# Patient Record
Sex: Female | Born: 2000 | Race: Black or African American | Hispanic: No | Marital: Single | Smoking: Never smoker
Health system: Southern US, Community
[De-identification: ages and names within clinical notes are randomized; demographics above are authoritative.]

## PROBLEM LIST (undated history)

## (undated) DIAGNOSIS — F329 Major depressive disorder, single episode, unspecified: Secondary | ICD-10-CM

## (undated) DIAGNOSIS — F32A Depression, unspecified: Secondary | ICD-10-CM

## (undated) DIAGNOSIS — F909 Attention-deficit hyperactivity disorder, unspecified type: Secondary | ICD-10-CM

## (undated) DIAGNOSIS — J45909 Unspecified asthma, uncomplicated: Secondary | ICD-10-CM

---
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
## 2021-04-02 ENCOUNTER — Other Ambulatory Visit: Payer: Self-pay

## 2021-04-03 ENCOUNTER — Other Ambulatory Visit: Payer: Self-pay

## 2021-04-03 ENCOUNTER — Encounter (HOSPITAL_COMMUNITY): Payer: Self-pay

## 2021-04-03 ENCOUNTER — Emergency Department (HOSPITAL_COMMUNITY): Admission: EM | Admit: 2021-04-03 | Payer: Medicaid Other | Source: Home / Self Care

## 2021-04-03 ENCOUNTER — Emergency Department (HOSPITAL_COMMUNITY)
Admission: EM | Admit: 2021-04-03 | Discharge: 2021-04-03 | Disposition: A | Discharge status: Home / Self Care | Payer: Medicaid Other | Attending: Emergency Medicine | Admitting: Emergency Medicine

## 2021-04-03 DIAGNOSIS — Z79899 Other long term (current) drug therapy: Secondary | ICD-10-CM | POA: Diagnosis not present

## 2021-04-03 DIAGNOSIS — J069 Acute upper respiratory infection, unspecified: Secondary | ICD-10-CM | POA: Diagnosis not present

## 2021-04-03 DIAGNOSIS — J45909 Unspecified asthma, uncomplicated: Secondary | ICD-10-CM | POA: Diagnosis not present

## 2021-04-03 DIAGNOSIS — Z20822 Contact with and (suspected) exposure to covid-19: Secondary | ICD-10-CM | POA: Diagnosis not present

## 2021-04-03 DIAGNOSIS — Z7722 Contact with and (suspected) exposure to environmental tobacco smoke (acute) (chronic): Secondary | ICD-10-CM | POA: Diagnosis not present

## 2021-04-03 DIAGNOSIS — J029 Acute pharyngitis, unspecified: Secondary | ICD-10-CM | POA: Diagnosis present

## 2021-04-03 LAB — CBC WITH DIFFERENTIAL/PLATELET
Abs Immature Granulocytes: 0.03 10*3/uL (ref 0.00–0.07)
Basophils Absolute: 0 10*3/uL (ref 0.0–0.1)
Basophils Relative: 0 %
Eosinophils Absolute: 0.2 10*3/uL (ref 0.0–0.5)
Eosinophils Relative: 2 %
HCT: 34.7 % — ABNORMAL LOW (ref 36.0–46.0)
Hemoglobin: 11.9 g/dL — ABNORMAL LOW (ref 12.0–15.0)
Immature Granulocytes: 0 %
Lymphocytes Relative: 25 %
Lymphs Abs: 2.7 10*3/uL (ref 0.7–4.0)
MCH: 24.5 pg — ABNORMAL LOW (ref 26.0–34.0)
MCHC: 34.3 g/dL (ref 30.0–36.0)
MCV: 71.5 fL — ABNORMAL LOW (ref 80.0–100.0)
Monocytes Absolute: 1 10*3/uL (ref 0.1–1.0)
Monocytes Relative: 9 %
Neutro Abs: 6.8 10*3/uL (ref 1.7–7.7)
Neutrophils Relative %: 64 %
Platelets: 325 10*3/uL (ref 150–400)
RBC: 4.85 MIL/uL (ref 3.87–5.11)
RDW: 13.4 % (ref 11.5–15.5)
WBC: 10.7 10*3/uL — ABNORMAL HIGH (ref 4.0–10.5)
nRBC: 0 % (ref 0.0–0.2)

## 2021-04-03 LAB — URINALYSIS, ROUTINE W REFLEX MICROSCOPIC
Bilirubin Urine: NEGATIVE
Glucose, UA: NEGATIVE mg/dL
Ketones, ur: NEGATIVE mg/dL
Nitrite: POSITIVE — AB
Protein, ur: NEGATIVE mg/dL
Specific Gravity, Urine: 1.029 (ref 1.005–1.030)
pH: 5 (ref 5.0–8.0)

## 2021-04-03 LAB — COMPREHENSIVE METABOLIC PANEL
ALT: 9 U/L (ref 0–44)
AST: 16 U/L (ref 15–41)
Albumin: 4 g/dL (ref 3.5–5.0)
Alkaline Phosphatase: 57 U/L (ref 38–126)
Anion gap: 6 (ref 5–15)
BUN: 12 mg/dL (ref 6–20)
CO2: 28 mmol/L (ref 22–32)
Calcium: 9.2 mg/dL (ref 8.9–10.3)
Chloride: 103 mmol/L (ref 98–111)
Creatinine, Ser: 0.74 mg/dL (ref 0.44–1.00)
GFR, Estimated: >60 mL/min (ref >60)
Glucose, Bld: 99 mg/dL (ref 70–99)
Potassium: 4.1 mmol/L (ref 3.5–5.1)
Sodium: 137 mmol/L (ref 135–145)
Total Bilirubin: 0.3 mg/dL (ref 0.3–1.2)
Total Protein: 7.7 g/dL (ref 6.5–8.1)

## 2021-04-03 LAB — GROUP A STREP BY PCR: Group A Strep by PCR: NOT DETECTED

## 2021-04-03 LAB — RESP PANEL BY RT-PCR (FLU A&B, COVID) ARPGX2
Influenza A by PCR: NEGATIVE
Influenza B by PCR: NEGATIVE
SARS Coronavirus 2 by RT PCR: NEGATIVE

## 2021-04-03 LAB — I-STAT BETA HCG BLOOD, ED (MC, WL, AP ONLY): I-stat hCG, quantitative: <5 m[IU]/mL (ref <5)

## 2021-04-03 MED ORDER — FOSFOMYCIN TROMETHAMINE 3 G PO PACK
3.0000 g | PACK | Freq: Once | ORAL | Status: AC
  Administered 2021-04-03: 3 g via ORAL
  Filled 2021-04-03: qty 3

## 2021-04-03 MED ORDER — ALBUTEROL SULFATE HFA 108 (90 BASE) MCG/ACT IN AERS
2.0000 | INHALATION_SPRAY | Freq: Once | RESPIRATORY_TRACT | Status: AC
  Administered 2021-04-03: 2 via RESPIRATORY_TRACT
  Filled 2021-04-03: qty 6.7

## 2021-04-03 NOTE — ED Notes (Signed)
Labeled specimen cup provided to pt for urine collection per MD order. ENMiles 

## 2021-04-03 NOTE — ED Triage Notes (Signed)
Pt has sore throat and cough x 2 days.

## 2021-04-03 NOTE — ED Provider Notes (Signed)
[name]-EMERGENCY DEPT Provider Note   CSN: [id number] Arrival date & time: 04/03/21  0043     History Chief Complaint  Patient presents with   Sore Throat   Cough    [name] [name] is a 20 y.o. female.  20 yo F with a chief complaints of sore throat and cough going on for a couple days.  Her daughter has similar symptoms.  She lost her voice.  Is experiencing severe throat pain.  Has pain with swallowing.  Denies urinary symptoms denies abdominal pain denies nausea or vomiting.  The history is provided by the patient.  Sore Throat Pertinent negatives include no chest pain, no headaches and no shortness of breath.  Cough Associated symptoms: sore throat   Associated symptoms: no chest pain, no chills, no fever, no headaches, no myalgias, no rhinorrhea, no shortness of breath and no wheezing   Illness Severity:  Moderate Onset quality:  Gradual Duration:  2 days Timing:  Constant Progression:  Worsening Chronicity:  New Associated symptoms: cough and sore throat   Associated symptoms: no chest pain, no congestion, no fever, no headaches, no myalgias, no nausea, no rhinorrhea, no shortness of breath, no vomiting and no wheezing       Past Medical History:  Diagnosis Date   ADHD (attention deficit hyperactivity disorder)    Asthma    pt. 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 No pertinent surgical history.   OB History   No obstetric history on file.     Family History  Adopted: Yes    Social History   Tobacco Use   Smoking status: Passive Smoke  Exposure - Never Smoker   Smokeless tobacco: Never  Substance Use Topics   Alcohol use: Never   Drug use: Never    Home Medications Prior to Admission medications   Medication Sig Start Date End Date Taking? Authorizing Provider  ARIPiprazole (ABILIFY) 10 MG tablet Take 1 tablet (10 mg total) by mouth daily. 12/28/15   Starkes-Perry, Juel Burrow, FNP  sertraline (ZOLOFT) 100 MG tablet Take 100 mg by mouth daily.    [provider]  traZODone (DESYREL) 150 MG tablet Take 150 mg by mouth at bedtime.    [provider]    Allergies    Patient has no known allergies.  Review of Systems   Review of Systems  Constitutional:  Negative for chills and fever.  HENT:  Positive for sore throat. Negative for congestion and rhinorrhea.   Eyes:  Negative for redness and visual disturbance.  Respiratory:  Positive for cough. Negative for shortness of breath and wheezing.   Cardiovascular:  Negative for chest pain and palpitations.  Gastrointestinal:  Negative for nausea and vomiting.  Genitourinary:  Negative for dysuria and urgency.  Musculoskeletal:  Negative for arthralgias and myalgias.  Skin:  Negative for pallor and wound.  Neurological:  Negative for dizziness and headaches.   Physical Exam Updated Vital Signs BP (!) 141/88 (BP Location: Left Arm)   Pulse 72   Temp 98.8 F (37.1 C) (Axillary)   Resp 16   Ht 5\' 2"  (1.575 m)  Wt 65.8 kg   SpO2 99%   BMI 26.52 kg/m   Physical Exam Vitals and nursing note reviewed.  Constitutional:      General: She is not in acute distress.    Appearance: She is well-developed. She is not diaphoretic.  HENT:     Head: Normocephalic and atraumatic.     Comments: Vesicles to the posterior pharynx.  No tonsillar swellings or exudates.  Tolerating secretions without difficulty. Eyes:     Pupils: Pupils are equal, round, and reactive to light.  Cardiovascular:     Rate and Rhythm: Normal rate and regular rhythm.     Heart sounds:  No murmur heard.   No friction rub. No gallop.  Pulmonary:     Effort: Pulmonary effort is normal.     Breath sounds: No wheezing or rales.  Abdominal:     General: There is no distension.     Palpations: Abdomen is soft.     Tenderness: There is no abdominal tenderness.  Musculoskeletal:        General: No tenderness.     Cervical back: Normal range of motion and neck supple.  Skin:    General: Skin is warm and dry.  Neurological:     Mental Status: She is alert and oriented to person, place, and time.  Psychiatric:        Behavior: Behavior normal.    ED Results / Procedures / Treatments   Labs (all labs ordered are listed, but only abnormal results are displayed) Labs Reviewed  URINALYSIS, ROUTINE W REFLEX MICROSCOPIC - Abnormal; Notable for the following components:      Result Value   APPearance HAZY (*)    Hgb urine dipstick SMALL (*)    Nitrite POSITIVE (*)    Leukocytes,Ua TRACE (*)    Bacteria, UA MANY (*)    All other components within normal limits  CBC WITH DIFFERENTIAL/PLATELET - Abnormal; Notable for the following components:   WBC 10.7 (*)    Hemoglobin 11.9 (*)    HCT 34.7 (*)    MCV 71.5 (*)    MCH 24.5 (*)    All other components within normal limits  RESP PANEL BY RT-PCR (FLU A&B, COVID) ARPGX2  GROUP A STREP BY PCR  COMPREHENSIVE METABOLIC PANEL  I-STAT BETA HCG BLOOD, ED ([facility], [facility], [facility] ONLY)    EKG EKG Interpretation  Date/Time:  Wednesday April 03 2021 02:22:20 EDT Ventricular Rate:  88 PR Interval:  113 QRS Duration: 71 QT Interval:  366 QTC Calculation: 443 R Axis:   67 Text Interpretation: Sinus rhythm Borderline short PR interval No significant change since last tracing Confirmed by Melene Plan [phone]) on 04/03/2021 5:05:05 AM  Radiology No results found.  Procedures Procedures   Medications Ordered in ED Medications - No data to display  ED Course  I have reviewed the triage vital signs and the nursing notes.  Pertinent  labs & imaging results that were available during my care of the patient were reviewed by me and considered in my medical decision making (see chart for details).    MDM Rules/Calculators/A&P                           20 yo F with a chief complaint of a sore throat.  Going on for a couple days.  Well-appearing and nontoxic.  Appears well-hydrated.  Exam most consistent with a viral upper respiratory illness.  Will treat conservatively.  Patient without urinary symptoms but has a UTI on UA.  Will treat with 1 dose of fosfomycin.  PCP follow-up.  5:56 AM:  I have discussed the diagnosis/risks/treatment options with the patient and believe the pt to be eligible for discharge home to follow-up with PCP. We also discussed returning to the ED immediately if new or worsening sx occur. We discussed the sx which are most concerning (e.g., sudden worsening pain, fever, inability to tolerate by mouth) that necessitate immediate return. Medications administered to the patient during their visit and any new prescriptions provided to the patient are listed below.  Medications given during this visit Medications - No data to display   The patient appears reasonably screen and/or stabilized for discharge and I doubt any other medical condition or other [facility] requiring further screening, evaluation, or treatment in the ED at this time prior to discharge.   Final Clinical Impression(s) / ED Diagnoses Final diagnoses:  Upper respiratory tract infection, unspecified type    Rx / DC Orders ED Discharge Orders     None        Melene Plan, DO 04/03/21 [phone]

## 2021-04-03 NOTE — Discharge Instructions (Signed)
Take tylenol 2 pills 4 times a day and motrin 4 pills 3 times a day.  Drink plenty of fluids.  Return for worsening shortness of breath, headache, confusion. Follow up with your family doctor.   

## 2021-04-03 NOTE — ED Notes (Signed)
Pt seen exiting the lobby when asked if she still wanted to be seen patient didn't respond

## 2021-08-03 ENCOUNTER — Emergency Department (HOSPITAL_COMMUNITY)
Admission: EM | Admit: 2021-08-03 | Discharge: 2021-08-03 | Discharge status: Against Medical Advice | Payer: Medicaid Other | Attending: Emergency Medicine | Admitting: Emergency Medicine

## 2021-08-03 ENCOUNTER — Emergency Department (HOSPITAL_COMMUNITY): Payer: Medicaid Other

## 2021-08-03 ENCOUNTER — Other Ambulatory Visit: Payer: Self-pay

## 2021-08-03 DIAGNOSIS — O209 Hemorrhage in early pregnancy, unspecified: Secondary | ICD-10-CM | POA: Insufficient documentation

## 2021-08-03 DIAGNOSIS — N939 Abnormal uterine and vaginal bleeding, unspecified: Secondary | ICD-10-CM

## 2021-08-03 DIAGNOSIS — Z3A08 8 weeks gestation of pregnancy: Secondary | ICD-10-CM | POA: Insufficient documentation

## 2021-08-03 LAB — URINALYSIS, ROUTINE W REFLEX MICROSCOPIC
Bilirubin Urine: NEGATIVE
Glucose, UA: NEGATIVE mg/dL
Hgb urine dipstick: NEGATIVE
Ketones, ur: NEGATIVE mg/dL
Leukocytes,Ua: NEGATIVE
Nitrite: NEGATIVE
Protein, ur: NEGATIVE mg/dL
Specific Gravity, Urine: 1.018 (ref 1.005–1.030)
pH: 8 (ref 5.0–8.0)

## 2021-08-03 LAB — WET PREP, GENITAL
Clue Cells Wet Prep HPF POC: NONE SEEN
Sperm: NONE SEEN
Trich, Wet Prep: NONE SEEN
WBC, Wet Prep HPF POC: <10 (ref <10)
Yeast Wet Prep HPF POC: NONE SEEN

## 2021-08-03 LAB — CBC WITH DIFFERENTIAL/PLATELET
Abs Immature Granulocytes: 0.03 10*3/uL (ref 0.00–0.07)
Basophils Absolute: 0 10*3/uL (ref 0.0–0.1)
Basophils Relative: 1 %
Eosinophils Absolute: 0.4 10*3/uL (ref 0.0–0.5)
Eosinophils Relative: 5 %
HCT: 39.8 % (ref 36.0–46.0)
Hemoglobin: 13.3 g/dL (ref 12.0–15.0)
Immature Granulocytes: 0 %
Lymphocytes Relative: 27 %
Lymphs Abs: 2.4 10*3/uL (ref 0.7–4.0)
MCH: 24.7 pg — ABNORMAL LOW (ref 26.0–34.0)
MCHC: 33.4 g/dL (ref 30.0–36.0)
MCV: 74 fL — ABNORMAL LOW (ref 80.0–100.0)
Monocytes Absolute: 0.6 10*3/uL (ref 0.1–1.0)
Monocytes Relative: 6 %
Neutro Abs: 5.4 10*3/uL (ref 1.7–7.7)
Neutrophils Relative %: 61 %
Platelets: 274 10*3/uL (ref 150–400)
RBC: 5.38 MIL/uL — ABNORMAL HIGH (ref 3.87–5.11)
RDW: 14.3 % (ref 11.5–15.5)
WBC: 8.8 10*3/uL (ref 4.0–10.5)
nRBC: 0 % (ref 0.0–0.2)

## 2021-08-03 LAB — COMPREHENSIVE METABOLIC PANEL
ALT: 10 U/L (ref 0–44)
AST: 15 U/L (ref 15–41)
Albumin: 3.7 g/dL (ref 3.5–5.0)
Alkaline Phosphatase: 45 U/L (ref 38–126)
Anion gap: 7 (ref 5–15)
BUN: 7 mg/dL (ref 6–20)
CO2: 23 mmol/L (ref 22–32)
Calcium: 8.8 mg/dL — ABNORMAL LOW (ref 8.9–10.3)
Chloride: 109 mmol/L (ref 98–111)
Creatinine, Ser: 0.75 mg/dL (ref 0.44–1.00)
GFR, Estimated: >60 mL/min (ref >60)
Glucose, Bld: 81 mg/dL (ref 70–99)
Potassium: 3.8 mmol/L (ref 3.5–5.1)
Sodium: 139 mmol/L (ref 135–145)
Total Bilirubin: 0.3 mg/dL (ref 0.3–1.2)
Total Protein: 6.5 g/dL (ref 6.5–8.1)

## 2021-08-03 LAB — I-STAT BETA HCG BLOOD, ED (MC, WL, AP ONLY): I-stat hCG, quantitative: <5 m[IU]/mL (ref <5)

## 2021-08-03 LAB — HCG, QUANTITATIVE, PREGNANCY: hCG, Beta Chain, Quant, S: <1 m[IU]/mL (ref <5)

## 2021-08-03 LAB — LIPASE, BLOOD: Lipase: 23 U/L (ref 11–51)

## 2021-08-03 NOTE — ED Triage Notes (Addendum)
Pt here for lower abd cramps that started last night w/ vaginal bleeding that started approx 1 hr ago. Pt states she had 4 positive home pregnancy tests last week w/ N/V since. PT states her last menstrual period was "2 months ago"

## 2021-08-03 NOTE — ED Provider Notes (Signed)
[facility] EMERGENCY DEPARTMENT Provider Note   CSN: [id number] Arrival date & time: 08/03/21  1245     History  Chief Complaint  Patient presents with   Abdominal Pain   Vaginal Bleeding    [name] [name] is a 21 y.o. female.  21 y.o female G2P1 currently [redacted] weeks pregnant (per patient) presents to the ED with a chief complaint of vaginal bleeding.  According to patient, her last menstrual period was approximately 2 months ago, she took 4 at home pregnancy test which all showed to be positive.  She has had some nausea along with some vomiting and some vaginal spotting, reports similar symptoms in the past.  On today's that she reports taking a shower prior to arrival and noticing large amounts of blood in the shower, she has had previous spotting with a previous pregnancy however not this much.  Endorsing some lower abdominal cramping.  No vomiting, no diarrhea, no discharge.  The history is provided by the patient and medical records.  Abdominal Pain Pain location:  Suprapubic, LLQ and RLQ Pain quality: cramping   Pain radiates to:  Does not radiate Pain severity:  Moderate Onset quality:  Sudden Duration:  5 hours Timing:  Constant Progression:  Partially resolved Chronicity:  New Associated symptoms: vaginal bleeding   Associated symptoms: no chest pain, no chills, no fever, no nausea, no shortness of breath, no sore throat, no vaginal discharge and no vomiting   Vaginal Bleeding Associated symptoms: abdominal pain   Associated symptoms: no fever, no nausea and no vaginal discharge       Home Medications Prior to Admission medications   Medication Sig Start Date End Date Taking? Authorizing Provider  ARIPiprazole (ABILIFY) 10 MG tablet Take 1 tablet (10 mg total) by mouth daily. 12/28/15   Starkes-Perry, Juel Burrow, FNP  sertraline (ZOLOFT) 100 MG tablet Take 100 mg by mouth daily.    [provider]  traZODone (DESYREL) 150 MG tablet Take 150 mg  by mouth at bedtime.    [provider]      Allergies    Patient has no known allergies.    Review of Systems   Review of Systems  Constitutional:  Negative for chills and fever.  HENT:  Negative for sore throat.   Respiratory:  Negative for shortness of breath.   Cardiovascular:  Negative for chest pain.  Gastrointestinal:  Positive for abdominal pain. Negative for nausea and vomiting.  Genitourinary:  Positive for vaginal bleeding. Negative for flank pain and vaginal discharge.  All other systems reviewed and are negative.  Physical Exam Updated Vital Signs BP (!) 131/93 (BP Location: Right Arm)    Pulse 94    Temp 98.6 F (37 C) (Oral)    Resp 16    SpO2 100%  Physical Exam Vitals and nursing note reviewed. Exam conducted with a chaperone present.  Constitutional:      Appearance: She is well-developed.  HENT:     Head: Normocephalic and atraumatic.  Cardiovascular:     Rate and Rhythm: Normal rate.  Abdominal:     General: Abdomen is flat. Bowel sounds are normal.     Palpations: Abdomen is soft.  Genitourinary:    Exam position: Supine.     Cervix: Cervical bleeding present. No cervical motion tenderness.     Adnexa:        Right: No tenderness.         Left: No tenderness.  Comments: Cervix without any petechia, lesions noted.  Tenderness along the suprapubic area.  No adnexal tenderness or fullness noted on exam. Skin:    General: Skin is warm and dry.  Neurological:     Mental Status: She is alert and oriented to person, place, and time.    ED Results / Procedures / Treatments   Labs (all labs ordered are listed, but only abnormal results are displayed) Labs Reviewed  CBC WITH DIFFERENTIAL/PLATELET - Abnormal; Notable for the following components:      Result Value   RBC 5.38 (*)    MCV 74.0 (*)    MCH 24.7 (*)    All other components within normal limits  COMPREHENSIVE METABOLIC PANEL - Abnormal; Notable for the following components:    Calcium 8.8 (*)    All other components within normal limits  URINALYSIS, ROUTINE W REFLEX MICROSCOPIC - Abnormal; Notable for the following components:   APPearance HAZY (*)    All other components within normal limits  WET PREP, GENITAL  LIPASE, BLOOD  HCG, QUANTITATIVE, PREGNANCY  I-STAT BETA HCG BLOOD, ED ([facility], [facility], [facility] ONLY)  GC/CHLAMYDIA PROBE AMP () NOT AT [facility]    EKG None  Radiology No results found.  Procedures Procedures    Medications Ordered in ED Medications - No data to display  ED Course/ Medical Decision Making/ A&P                           Medical Decision Making Amount and/or Complexity of Data Reviewed Labs: ordered. Radiology: ordered. ECG/medicine tests: ordered.  This patient presents to the ED for concern of vaginal bleeding, this involves a number of treatment options, and is a complaint that carries with it a high risk of complications and morbidity.  The differential diagnosis includes ectopic pregnancy, miscarriage versus AUB.    Co morbidities: Discussed in HPI   Brief History:    EMR reviewed including pt PMHx, past surgical history and past visits to ER.   See HPI for more details   Lab Tests:  I ordered and independently interpreted labs.  The pertinent results include:    I personally reviewed all laboratory work and imaging. Metabolic panel without any acute abnormality specifically kidney function within normal limits and no significant electrolyte abnormalities. CBC without leukocytosis or significant anemia.  Wet prep without any white blood cell count, no trichomonas, no signs of yeast.  Gonorrhea and chlamydia have been sent out for stain.   Imaging Studies:   Medical ultrasound was ordered for patient, however patient was unable to receive an ultrasound as she left AGAINST MEDICAL ADVICE  Reevaluation:  After the interventions noted above I re-evaluated patient and found that they have :stayed the  same   Social Determinants of Health:  The patient's social determinants of health were a factor in the care of this patient    Problem List / ED Course:  Patient presents to the ED with vaginal bleeding for the last couple hours.  Patient's last menstrual cycle approximately 2 months ago, she had some lower abdominal cramping that began this morning.  She is concerned for miscarriage versus ectopic at this time.  Prior history of vaginal bleeding in pregnancy, does have 1 child at home.  Labs obtained today did not show any decrease in hemoglobin.  Pelvic exam performed by me with significant suprapubic pain but no CMT or adnexal tenderness.  Ultrasound was ordered to evaluate IUP.  hCG was negative on today's visit, a repeat quantitative was also obtained which was negative?Marland Kitchen   Dispostion:  Patient left hand medical advice due to "he does not know I am here ", unable to complete the entire examination along with obtain ultrasound.  However, patient is not pregnant according to her lab testing on today's visit.     Portions of this note were generated with Scientist, clinical (histocompatibility and immunogenetics). Dictation errors may occur despite best attempts at proofreading.   Final Clinical Impression(s) / ED Diagnoses Final diagnoses:  Vaginal bleeding    Rx / DC Orders ED Discharge Orders     None         Claude Manges, PA-C 08/03/21 1529    Virgina Norfolk, DO 08/03/21 1554

## 2021-08-05 LAB — GC/CHLAMYDIA PROBE AMP (~~LOC~~) NOT AT ARMC
Chlamydia: NEGATIVE
Comment: NEGATIVE
Comment: NORMAL
Neisseria Gonorrhea: NEGATIVE

## 2022-05-14 ENCOUNTER — Emergency Department (HOSPITAL_COMMUNITY)
Admission: EM | Admit: 2022-05-14 | Discharge: 2022-05-15 | Disposition: A | Discharge status: Home / Self Care | Payer: Medicaid Other | Attending: Emergency Medicine | Admitting: Emergency Medicine

## 2022-05-14 ENCOUNTER — Other Ambulatory Visit: Payer: Self-pay

## 2022-05-14 DIAGNOSIS — R059 Cough, unspecified: Secondary | ICD-10-CM | POA: Insufficient documentation

## 2022-05-14 DIAGNOSIS — R0981 Nasal congestion: Secondary | ICD-10-CM | POA: Insufficient documentation

## 2022-05-14 DIAGNOSIS — R55 Syncope and collapse: Secondary | ICD-10-CM | POA: Insufficient documentation

## 2022-05-14 DIAGNOSIS — R42 Dizziness and giddiness: Secondary | ICD-10-CM | POA: Insufficient documentation

## 2022-05-14 DIAGNOSIS — Z20822 Contact with and (suspected) exposure to covid-19: Secondary | ICD-10-CM | POA: Diagnosis not present

## 2022-05-14 DIAGNOSIS — R1110 Vomiting, unspecified: Secondary | ICD-10-CM | POA: Insufficient documentation

## 2022-05-14 LAB — BASIC METABOLIC PANEL
Anion gap: 5 (ref 5–15)
BUN: 10 mg/dL (ref 6–20)
CO2: 27 mmol/L (ref 22–32)
Calcium: 8.6 mg/dL — ABNORMAL LOW (ref 8.9–10.3)
Chloride: 107 mmol/L (ref 98–111)
Creatinine, Ser: 0.8 mg/dL (ref 0.44–1.00)
GFR, Estimated: >60 mL/min (ref >60)
Glucose, Bld: 67 mg/dL — ABNORMAL LOW (ref 70–99)
Potassium: 3.5 mmol/L (ref 3.5–5.1)
Sodium: 139 mmol/L (ref 135–145)

## 2022-05-14 LAB — CBC WITH DIFFERENTIAL/PLATELET
Abs Immature Granulocytes: 0.03 10*3/uL (ref 0.00–0.07)
Basophils Absolute: 0.1 10*3/uL (ref 0.0–0.1)
Basophils Relative: 1 %
Eosinophils Absolute: 0.4 10*3/uL (ref 0.0–0.5)
Eosinophils Relative: 5 %
HCT: 34.1 % — ABNORMAL LOW (ref 36.0–46.0)
Hemoglobin: 11.7 g/dL — ABNORMAL LOW (ref 12.0–15.0)
Immature Granulocytes: 0 %
Lymphocytes Relative: 17 %
Lymphs Abs: 1.6 10*3/uL (ref 0.7–4.0)
MCH: 25.4 pg — ABNORMAL LOW (ref 26.0–34.0)
MCHC: 34.3 g/dL (ref 30.0–36.0)
MCV: 74 fL — ABNORMAL LOW (ref 80.0–100.0)
Monocytes Absolute: 0.8 10*3/uL (ref 0.1–1.0)
Monocytes Relative: 9 %
Neutro Abs: 6.3 10*3/uL (ref 1.7–7.7)
Neutrophils Relative %: 68 %
Platelets: 255 10*3/uL (ref 150–400)
RBC: 4.61 MIL/uL (ref 3.87–5.11)
RDW: 13.8 % (ref 11.5–15.5)
WBC: 9.2 10*3/uL (ref 4.0–10.5)
nRBC: 0 % (ref 0.0–0.2)

## 2022-05-14 LAB — HCG, QUANTITATIVE, PREGNANCY: hCG, Beta Chain, Quant, S: <1 m[IU]/mL (ref <5)

## 2022-05-14 NOTE — ED Provider Triage Note (Signed)
Emergency Medicine Provider Triage Evaluation Note  [name] [name] , a 21 y.o. female  was evaluated in triage.  Pt complains of headaches, nasal congestion, body aches x2 weeks.  Also endorses 4 syncopal events in the last few weeks.  States it happens when she is standing upright, she feels lightheaded and then loses consciousness.  Denies hitting her head..  Review of Systems  Per HPI  Physical Exam  BP 116/74   Pulse 93   Temp 98.2 F (36.8 C) (Oral)   Resp 18   Ht 5' 2.5" (1.588 m)   Wt 59 kg   SpO2 100%   BMI 23.40 kg/m  Gen:   Awake, no distress   Resp:  Normal effort  MSK:   Moves extremities without difficulty  Other:  Regular rhythm   Medical Decision Making  Medically screening exam initiated at 9:48 PM.  Appropriate orders placed.  [name] was informed that the remainder of the evaluation will be completed by another provider, this initial triage assessment does not replace that evaluation, and the importance of remaining in the ED until their evaluation is complete.     Theron Arista, PA-C 05/14/22 2151

## 2022-05-14 NOTE — ED Triage Notes (Signed)
Patient coming to ED for evaluation of nasal congestion and body aches x 1 week.  Reports "I just don't feel good."  Has had intermittent "passing out spells."  States fainting "is normal for me."

## 2022-05-14 NOTE — ED Notes (Signed)
Patient found in waiting room lobby bathroom laying on the floor.  Patient vomiting.  Was assisted to wheelchair by NT.  Family with patient

## 2022-05-14 NOTE — ED Notes (Signed)
Attempted lab draw, pt. was vomiting in trash can.

## 2022-05-15 ENCOUNTER — Emergency Department (HOSPITAL_COMMUNITY): Payer: Medicaid Other

## 2022-05-15 LAB — RESP PANEL BY RT-PCR (FLU A&B, COVID) ARPGX2
Influenza A by PCR: NEGATIVE
Influenza B by PCR: NEGATIVE
SARS Coronavirus 2 by RT PCR: NEGATIVE

## 2022-05-15 LAB — CBG MONITORING, ED: Glucose-Capillary: 85 mg/dL (ref 70–99)

## 2022-05-15 MED ORDER — LACTATED RINGERS IV SOLN: INTRAVENOUS | Status: DC

## 2022-05-15 MED ORDER — LACTATED RINGERS IV BOLUS
2000.0000 mL | Freq: Once | INTRAVENOUS | Status: AC
  Administered 2022-05-15: 2000 mL via INTRAVENOUS

## 2022-05-15 NOTE — ED Provider Notes (Signed)
[facility]-EMERGENCY DEPT Provider Note   CSN: [id number] Arrival date & time: 05/14/22  2134     History  Chief Complaint  Patient presents with   Nasal Congestion   Near Syncope    [name] [name] is a 21 y.o. female.  21 year old female presents with syncope x 2 weeks.  States she gets dizzy lightheaded and had a syncopal event.  No associated headache or chest pain or shortness of breath before these incidents.  No loss of bowel or bladder function.  Unsure if she has any seizure-like activity or postictal period.  Also complains of nasal congestion without fever or neck ache or photophobia.  Slight cough but no dyspnea.  Has had emesis x 2 but denies any watery diarrhea.  No abdominal discomfort.  No treatment use prior to arrival       Home Medications Prior to Admission medications   Medication Sig Start Date End Date Taking? Authorizing Provider  ARIPiprazole (ABILIFY) 10 MG tablet Take 1 tablet (10 mg total) by mouth daily. 12/28/15   Starkes-Perry, Juel Burrow, FNP  sertraline (ZOLOFT) 100 MG tablet Take 100 mg by mouth daily.    [provider]  traZODone (DESYREL) 150 MG tablet Take 150 mg by mouth at bedtime.    [provider]      Allergies    Patient has no known allergies.    Review of Systems   Review of Systems  All other systems reviewed and are negative.   Physical Exam Updated Vital Signs BP 111/65   Pulse 80   Temp 98.2 F (36.8 C) (Oral)   Resp 17   Ht 1.588 m (5' 2.5")   Wt 59 kg   SpO2 98%   BMI 23.40 kg/m  Physical Exam Vitals and nursing note reviewed.  Constitutional:      General: She is not in acute distress.    Appearance: Normal appearance. She is well-developed. She is not toxic-appearing.  HENT:     Head: Normocephalic and atraumatic.  Eyes:     General: Lids are normal.     Conjunctiva/sclera: Conjunctivae normal.     Pupils: Pupils are equal, round, and reactive to light.  Neck:      Thyroid: No thyroid mass.     Trachea: No tracheal deviation.  Cardiovascular:     Rate and Rhythm: Normal rate and regular rhythm.     Heart sounds: Normal heart sounds. No murmur heard.    No gallop.  Pulmonary:     Effort: Pulmonary effort is normal. No respiratory distress.     Breath sounds: Normal breath sounds. No stridor. No decreased breath sounds, wheezing, rhonchi or rales.  Abdominal:     General: There is no distension.     Palpations: Abdomen is soft.     Tenderness: There is no abdominal tenderness. There is no rebound.  Musculoskeletal:        General: No tenderness. Normal range of motion.     Cervical back: Normal range of motion and neck supple.  Skin:    General: Skin is warm and dry.     Findings: No abrasion or rash.  Neurological:     Mental Status: She is alert and oriented to person, place, and time. Mental status is at baseline.     GCS: GCS eye subscore is 4. GCS verbal subscore is 5. GCS motor subscore is 6.     Cranial Nerves: No cranial nerve deficit.  Sensory: No sensory deficit.     Motor: Motor function is intact.  Psychiatric:        Attention and Perception: Attention normal.        Speech: Speech normal.        Behavior: Behavior normal.     ED Results / Procedures / Treatments   Labs (all labs ordered are listed, but only abnormal results are displayed) Labs Reviewed  CBC WITH DIFFERENTIAL/PLATELET - Abnormal; Notable for the following components:      Result Value   Hemoglobin 11.7 (*)    HCT 34.1 (*)    MCV 74.0 (*)    MCH 25.4 (*)    All other components within normal limits  BASIC METABOLIC PANEL - Abnormal; Notable for the following components:   Glucose, Bld 67 (*)    Calcium 8.6 (*)    All other components within normal limits  RESP PANEL BY RT-PCR (FLU A&B, COVID) ARPGX2  HCG, QUANTITATIVE, PREGNANCY  CBG MONITORING, ED    EKG EKG Interpretation  Date/Time:  Wednesday May 14 2022 22:47:42 EST Ventricular  Rate:  78 PR Interval:  99 QRS Duration: 88 QT Interval:  361 QTC Calculation: 412 R Axis:   74 Text Interpretation: Sinus rhythm Short PR interval Minimal ST depression, inferior leads Confirmed by Lorre Nick ([id number]) on 05/15/2022 7:09:44 AM  Radiology No results found.  Procedures Procedures    Medications Ordered in ED Medications  lactated ringers bolus 2,000 mL (has no administration in time range)  lactated ringers infusion (has no administration in time range)    ED Course/ Medical Decision Making/ A&P                           Medical Decision Making Amount and/or Complexity of Data Reviewed Radiology: ordered.  Risk Prescription drug management.   Patient is EKG per interpretation shows normal sinus rhythm.  No acute ischemic or prolongation of the QT noted.  Patient describes several episodes of syncope and concern for possible seizure activity.  Head CT per interpretation is negative here.  Given IV fluids as patient feels she is dehydrated.  She does well but after this.  COVID and flu test both negative.  Blood sugar is appropriate.  Patient does not require admission at this time and was encouraged to follow doctor.  Return precautions given        Final Clinical Impression(s) / ED Diagnoses Final diagnoses:  None    Rx / DC Orders ED Discharge Orders     None         Lorre Nick, MD 05/15/22 1041

## 2022-05-24 ENCOUNTER — Emergency Department (HOSPITAL_COMMUNITY)
Admission: EM | Admit: 2022-05-24 | Discharge: 2022-05-25 | Disposition: A | Discharge status: Home / Self Care | Payer: Medicaid Other | Attending: Emergency Medicine | Admitting: Emergency Medicine

## 2022-05-24 ENCOUNTER — Other Ambulatory Visit: Payer: Self-pay

## 2022-05-24 ENCOUNTER — Encounter (HOSPITAL_COMMUNITY): Payer: Self-pay

## 2022-05-24 DIAGNOSIS — R42 Dizziness and giddiness: Secondary | ICD-10-CM | POA: Diagnosis not present

## 2022-05-24 DIAGNOSIS — R55 Syncope and collapse: Secondary | ICD-10-CM | POA: Insufficient documentation

## 2022-05-24 DIAGNOSIS — Z79899 Other long term (current) drug therapy: Secondary | ICD-10-CM | POA: Diagnosis not present

## 2022-05-24 DIAGNOSIS — R531 Weakness: Secondary | ICD-10-CM | POA: Diagnosis not present

## 2022-05-24 DIAGNOSIS — W01198A Fall on same level from slipping, tripping and stumbling with subsequent striking against other object, initial encounter: Secondary | ICD-10-CM | POA: Insufficient documentation

## 2022-05-24 NOTE — ED Provider Triage Note (Signed)
Emergency Medicine Provider Triage Evaluation Note  [name] [name] , a 21 y.o. female  was evaluated in triage.  Pt with multiple complaints. Reports hx of numerous syncopal episodes, happening about 3x/week for the past 2 months. Reports feeling lightheaded and dizzy, then wakes up on the ground. Some of the times she has had urinary incontinence, no tongue biting/  States she's had several workups for it without answers. Passed out earlier and hit her head. Her friend went to get her liquid tylenol and somehow accidentally gave her contact solution. Pt ingested this about 2 hours ago, has felt nauseous since.   Review of Systems  Positive: Headache, nausea, syncope Negative: vomiting  Physical Exam  BP 125/85 (BP Location: Right Arm)   Pulse 79   Temp (!) 97.4 F (36.3 C) (Oral)   Resp 18   Ht 5\' 3"  (1.6 m)   Wt 58.5 kg   LMP 05/15/2022   SpO2 100%   BMI 22.85 kg/m  Gen:   Awake, no distress   Resp:  Normal effort  MSK:   Moves extremities without difficulty  Other:    Medical Decision Making  Medically screening exam initiated at 11:58 PM.  Appropriate orders placed.  [name] [name] was informed that the remainder of the evaluation will be completed by another provider, this initial triage assessment does not replace that evaluation, and the importance of remaining in the ED until their evaluation is complete.  Workup initiated   Auria Mckinlay T, PA-C 05/25/22 0000

## 2022-05-24 NOTE — ED Triage Notes (Signed)
States that she has been having syncopal episodes, states three times in the last week. She also states that she fell today and hit her head, in that time she was attempting to take some tylenol when she ingested contact solution.

## 2022-05-25 ENCOUNTER — Emergency Department (HOSPITAL_COMMUNITY): Payer: Medicaid Other

## 2022-05-25 LAB — BASIC METABOLIC PANEL
Anion gap: 5 (ref 5–15)
BUN: 14 mg/dL (ref 6–20)
CO2: 27 mmol/L (ref 22–32)
Calcium: 9 mg/dL (ref 8.9–10.3)
Chloride: 108 mmol/L (ref 98–111)
Creatinine, Ser: 0.85 mg/dL (ref 0.44–1.00)
GFR, Estimated: >60 mL/min (ref >60)
Glucose, Bld: 91 mg/dL (ref 70–99)
Potassium: 3.6 mmol/L (ref 3.5–5.1)
Sodium: 140 mmol/L (ref 135–145)

## 2022-05-25 LAB — CBC
HCT: 36.9 % (ref 36.0–46.0)
Hemoglobin: 12.6 g/dL (ref 12.0–15.0)
MCH: 25.5 pg — ABNORMAL LOW (ref 26.0–34.0)
MCHC: 34.1 g/dL (ref 30.0–36.0)
MCV: 74.5 fL — ABNORMAL LOW (ref 80.0–100.0)
Platelets: 286 10*3/uL (ref 150–400)
RBC: 4.95 MIL/uL (ref 3.87–5.11)
RDW: 13.6 % (ref 11.5–15.5)
WBC: 8.5 10*3/uL (ref 4.0–10.5)
nRBC: 0 % (ref 0.0–0.2)

## 2022-05-25 LAB — CBG MONITORING, ED: Glucose-Capillary: 80 mg/dL (ref 70–99)

## 2022-05-25 LAB — RAPID URINE DRUG SCREEN, HOSP PERFORMED
Amphetamines: NOT DETECTED
Barbiturates: NOT DETECTED
Benzodiazepines: NOT DETECTED
Cocaine: NOT DETECTED
Opiates: NOT DETECTED
Tetrahydrocannabinol: POSITIVE — AB

## 2022-05-25 LAB — HCG, QUANTITATIVE, PREGNANCY: hCG, Beta Chain, Quant, S: <1 m[IU]/mL (ref <5)

## 2022-05-25 LAB — URINALYSIS, ROUTINE W REFLEX MICROSCOPIC
Bacteria, UA: NONE SEEN
Bilirubin Urine: NEGATIVE
Glucose, UA: NEGATIVE mg/dL
Hgb urine dipstick: NEGATIVE
Ketones, ur: NEGATIVE mg/dL
Nitrite: NEGATIVE
Protein, ur: NEGATIVE mg/dL
Specific Gravity, Urine: 1.027 (ref 1.005–1.030)
pH: 6 (ref 5.0–8.0)

## 2022-05-25 LAB — TROPONIN I (HIGH SENSITIVITY): Troponin I (High Sensitivity): 3 ng/L (ref <18)

## 2022-05-25 MED ORDER — LACTATED RINGERS IV BOLUS
1000.0000 mL | Freq: Once | INTRAVENOUS | Status: AC
  Administered 2022-05-25: 1000 mL via INTRAVENOUS

## 2022-05-25 NOTE — Discharge Instructions (Addendum)
You were seen in the today for your syncopal episodes.  Your physical exam, blood work, EKG and chest x-ray were very reassuring.  While the exact cause of your symptoms remains unclear, there does not appear to be any emergent problem at this time.  Please follow-up with the cardiologist listed below.  You have been given a referral.  If you have not heard from them by Tuesday please give them a call.  Please be sure to seat yourself or lie down when you begin to experience your symptoms of weakness and changes in your vision before you pass out.  Additionally when you are changing positions such as from lying to sitting or sitting to standing please do so slowly and give herself a moment to find a balance in your breath before you begin moving.  Please follow-up with your cardiologist as below and return to ER with any new severe symptoms.

## 2022-05-25 NOTE — ED Provider Notes (Signed)
[facility]-EMERGENCY DEPT Provider Note   CSN: [id number] Arrival date & time: 05/24/22  2317     History Chief Complaint  Patient presents with   Near Syncope   Ingestion    [name] [name] is a 21 y.o. female who presents with concern for recurrent syncopal episodes for last couple of weeks.  States that she is having 4-5 episodes of syncope in a week, sometimes with multiple episodes in a day.  Patient states that syncopal episodes primarily occur after she has changed position, she has preceding symptoms of generalized weakness, lightheadedness, and her vision out "closing in".  States that she has fallen multiple times as she does not choose to sit down or lie down when she begins to experience these symptoms.  Was concern for hitting her head this evening.  I personally reviewed her medical records.  She has history of ADD, MDD, suicide attempt by poisoning in the past.  Was evaluated on 12/6 for syncope as well with reassuring workup, at that time there was question of possible seizure-like activity therefore patient underwent CT of the head which was negative.  Has not attempted to follow-up with him in the outpatient setting.    HPI     Home Medications Prior to Admission medications   Medication Sig Start Date End Date Taking? Authorizing Provider  ARIPiprazole (ABILIFY) 10 MG tablet Take 1 tablet (10 mg total) by mouth daily. 12/28/15   Starkes-Perry, Juel Burrow, FNP  sertraline (ZOLOFT) 100 MG tablet Take 100 mg by mouth daily.    [provider]  traZODone (DESYREL) 150 MG tablet Take 150 mg by mouth at bedtime.    [provider]      Allergies    Patient has no known allergies.    Review of Systems   Review of Systems  Constitutional: Negative.   HENT: Negative.    Eyes: Negative.   Respiratory: Negative.    Cardiovascular: Negative.   Gastrointestinal: Negative.   Neurological:  Positive for syncope and light-headedness.     Physical Exam Updated Vital Signs BP (!) 111/99   Pulse 63   Temp 97.7 F (36.5 C) (Oral)   Resp 14   Ht 5\' 3"  (1.6 m)   Wt 58.5 kg   LMP 05/15/2022   SpO2 100%   BMI 22.85 kg/m  Physical Exam Vitals and nursing note reviewed.  Constitutional:      Appearance: She is not ill-appearing or toxic-appearing.  HENT:     Head: Normocephalic and atraumatic.     Mouth/Throat:     Mouth: Mucous membranes are moist.     Pharynx: No oropharyngeal exudate or posterior oropharyngeal erythema.  Eyes:     General:        Right eye: No discharge.        Left eye: No discharge.     Conjunctiva/sclera: Conjunctivae normal.  Cardiovascular:     Rate and Rhythm: Normal rate and regular rhythm.     Pulses: Normal pulses.     Heart sounds: Normal heart sounds. No murmur heard. Pulmonary:     Effort: Pulmonary effort is normal. No respiratory distress.     Breath sounds: Normal breath sounds. No wheezing or rales.  Abdominal:     General: Bowel sounds are normal. There is no distension.     Palpations: Abdomen is soft.     Tenderness: There is no abdominal tenderness. There is no guarding or rebound.  Musculoskeletal:  General: No deformity.     Cervical back: Neck supple.     Right lower leg: No edema.     Left lower leg: No edema.  Skin:    General: Skin is warm and dry.     Capillary Refill: Capillary refill takes less than 2 seconds.  Neurological:     General: No focal deficit present.     Mental Status: She is alert and oriented to person, place, and time. Mental status is at baseline.     GCS: GCS eye subscore is 4. GCS verbal subscore is 5. GCS motor subscore is 6.     Gait: Gait is intact.  Psychiatric:        Mood and Affect: Mood normal.     ED Results / Procedures / Treatments   Labs (all labs ordered are listed, but only abnormal results are displayed) Labs Reviewed  CBC - Abnormal; Notable for the following components:      Result Value   MCV 74.5 (*)     MCH 25.5 (*)    All other components within normal limits  URINALYSIS, ROUTINE W REFLEX MICROSCOPIC - Abnormal; Notable for the following components:   Leukocytes,Ua TRACE (*)    All other components within normal limits  RAPID URINE DRUG SCREEN, HOSP PERFORMED - Abnormal; Notable for the following components:   Tetrahydrocannabinol POSITIVE (*)    All other components within normal limits  BASIC METABOLIC PANEL  HCG, QUANTITATIVE, PREGNANCY  CBG MONITORING, ED  TROPONIN I (HIGH SENSITIVITY)  TROPONIN I (HIGH SENSITIVITY)    EKG EKG Interpretation  Date/Time:  Sunday May 25 2022 03:18:02 EST Ventricular Rate:  65 PR Interval:  105 QRS Duration: 74 QT Interval:  405 QTC Calculation: 422 R Axis:   73 Text Interpretation: Sinus rhythm Short PR interval Minimal ST depression, inferior leads Confirmed by Ross Marcus ([id number]) on 05/25/2022 5:30:26 AM  Radiology DG Chest 2 View  Result Date: 05/25/2022 CLINICAL DATA:  Syncope EXAM: CHEST - 2 VIEW COMPARISON:  05/25/2018 FINDINGS: The heart size and mediastinal contours are within normal limits. Both lungs are clear. The visualized skeletal structures are unremarkable. IMPRESSION: No active cardiopulmonary disease. Electronically Signed   By: Minerva Fester M.D.   On: 05/25/2022 03:40    Procedures Procedures    Medications Ordered in ED Medications  lactated ringers bolus 1,000 mL (0 mLs Intravenous Stopped 05/25/22 0603)    ED Course/ Medical Decision Making/ A&P Clinical Course as of 05/25/22 8413  Wynelle Link May 25, 2022  0616 Consult to cardiologist Dr. Jayme Cloud who states that given reassuring cardiac evaluation in the emergency department today, findings of orthostasis, and description of patient's symptoms as occurring with prodrome and typically immediately after changing position, do not feel any further workup is warranted in the ER at this time.  He does recommend close outpatient follow-up in their office for  further evaluation.  I appreciate his collaboration in care of this patient. [RS]    Clinical Course User Index [RS] Tabatha Razzano, Eugene Gavia, PA-C                           Medical Decision Making 21 year old female presents with concern for multiple syncopal episodes in the last couple of weeks.  Vital signs are normal on intake.  Cardiopulmonary abdominal exams are benign.  Patient neurologically intact without focal deficit on neurologic exam.  PERC negative, per [place] head CT rules, no indication for head  CT at this time despite patient's report of hitting her head on the wall after syncopal episode today.   The differential for syncope is extensive and includes, but is not limited to: arrythmia (Vtach, SVT, SSS, sinus arrest, AV block, bradycardia), aortic stenosis, AMI, hypertrophic obstructive cardiomyopathy (HOCM), PE, atrial myxoma, pulmonary hypertension, orthostatic hypotension, hypovolemia, drug effect, GB syndrome, micturition, cough, carotid sinus sensitivity, seizure, TIA/CVA, hypoglycemia, vertigo.   Amount and/or Complexity of Data Reviewed Labs: ordered.    Details: CBC without leukocytosis or anemia, BMP unremarkable, UA unremarkable, UDS positive for THC, troponin negative, CBG is normal, patient is not pregnant.   Radiology: ordered.    Details: Chest x-ray visualized this provider negative for acute cardiopulmonary disease.   ECG/medicine tests:     Details: EKG with normal sinus rhythm, no STEMI.   Case discussed with cardiologist as above, no occasion for further workup in the emergency department though patient does need to follow-up closely outpatient setting with outpatient Cardiologic services for continued evaluation of her syncopal episodes.  Patient was found to be orthostatic in the emergency department, administered fluid bolus.  Extensive discussion with the patient regarding slow changes in position particular with standing as well as educated that  should she begin to feel lightheaded or presyncopal she should sit down and lie down if possible to avoid head trauma.   Darleth voiced understanding of her medical evaluation and treatment plan. Each of their questions answered to their expressed satisfaction.  Return precautions were given.  Patient is well-appearing, stable, and was discharged in good condition.  This chart was dictated using voice recognition software, Dragon. Despite the best efforts of this provider to proofread and correct errors, errors may still occur which can change documentation meaning.   Final Clinical Impression(s) / ED Diagnoses Final diagnoses:  Syncope, unspecified syncope type    Rx / DC Orders ED Discharge Orders          Ordered    Ambulatory referral to Cardiology       Comments: If you have not heard from the Cardiology office within the next 72 hours please call [phone].   05/25/22 0617              Atalya Dano, Eugene Gavia, PA-C 05/25/22 2641    Shon Baton, MD 05/25/22 [id number]

## 2022-06-25 ENCOUNTER — Ambulatory Visit: Payer: Medicaid Other | Attending: Cardiovascular Disease | Admitting: Cardiovascular Disease

## 2022-06-25 NOTE — Progress Notes (Deleted)
No chief complaint on file.     History of Present Illness: 22 yo female with history of ADHD, depression and asthma who is here today as a new patient for the evaluation of syncope. She was seen in the ED at [facility] 05/15/22 and 05/25/22 after syncopal events. Possible seizure like activity. CT head negative.   Primary Care Physician: Pcp, No   Past Medical History:  Diagnosis Date   ADHD (attention deficit hyperactivity disorder)    Asthma    pt. reports most symptoms have resolved but has occassional SOB on exertion   Attention deficit hyperactivity disorder (ADHD) 10/10/2015   Depression     No past surgical history on file.  Current Outpatient Medications  Medication Sig Dispense Refill   ARIPiprazole (ABILIFY) 10 MG tablet Take 1 tablet (10 mg total) by mouth daily. 30 tablet 0   sertraline (ZOLOFT) 100 MG tablet Take 100 mg by mouth daily.     traZODone (DESYREL) 150 MG tablet Take 150 mg by mouth at bedtime.     No current facility-administered medications for this visit.    No Known Allergies  Social History   Socioeconomic History   Marital status: Single    Spouse name: Not on file   Number of children: Not on file   Years of education: Not on file   Highest education level: Not on file  Occupational History   Not on file  Tobacco Use   Smoking status: Passive Smoke Exposure - Never Smoker   Smokeless tobacco: Never  Substance and Sexual Activity   Alcohol use: Never   Drug use: Never   Sexual activity: Not Currently    Comment: Pt. reports not having had sex in last 6 months, states condom was used during last encounter  Other Topics Concern   Not on file  Social History Narrative   Not on file   Social Determinants of Health   Financial Resource Strain: Not on file  Food Insecurity: Not on file  Transportation Needs: Not on file  Physical Activity: Not on file  Stress: Not on file  Social Connections: Not on file  Intimate Partner Violence:  Not on file    Family History  Adopted: Yes    Review of Systems:  As stated in the HPI and otherwise negative.   There were no vitals taken for this visit.  Physical Examination: General: Well developed, well nourished, NAD  HEENT: OP clear, mucus membranes moist  SKIN: warm, dry. No rashes. Neuro: No focal deficits  Musculoskeletal: Muscle strength 5/5 all ext  Psychiatric: Mood and affect normal  Neck: No JVD, no carotid bruits, no thyromegaly, no lymphadenopathy.  Lungs:Clear bilaterally, no wheezes, rhonci, crackles Cardiovascular: Regular rate and rhythm. No murmurs, gallops or rubs. Abdomen:Soft. Bowel sounds present. Non-tender.  Extremities: No lower extremity edema. Pulses are 2 + in the bilateral DP/PT.  EKG:  EKG {ACTION; IS/IS [id number] ordered today. The ekg ordered today demonstrates ***  Recent Labs: 08/03/2021: ALT 10 05/25/2022: BUN 14; Creatinine, Ser 0.85; Hemoglobin 12.6; Platelets 286; Potassium 3.6; Sodium 140   Lipid Panel    Component Value Date/Time   CHOL 146 12/18/2015 0629   TRIG 64 12/18/2015 0629   HDL 42 12/18/2015 0629   CHOLHDL 3.5 12/18/2015 0629   VLDL 13 12/18/2015 0629   LDLCALC 91 12/18/2015 0629     Wt Readings from Last 3 Encounters:  05/24/22 58.5 kg  05/14/22 59 kg  04/03/21 65.8 kg  Assessment and Plan:   1. Syncope: Will arrange an echo to assess LV function and exclude structural heart disease. 30 day cardiac monitor.   Labs/ tests ordered today include:  No orders of the defined types were placed in this encounter.    Disposition:   F/U with me in ***    Signed, Lauree Chandler, MD, [facility] 06/25/2022 8:12 AM    [address], [place], [place]  [address] Phone: [phone]; Fax: [phone]

## 2022-07-02 ENCOUNTER — Ambulatory Visit: Payer: Medicaid Other | Attending: Cardiology | Admitting: Cardiology

## 2022-07-02 ENCOUNTER — Encounter: Payer: Self-pay | Admitting: Cardiology

## 2022-07-02 ENCOUNTER — Ambulatory Visit: Payer: Medicaid Other | Attending: Cardiology

## 2022-07-02 VITALS — BP 100/60 | HR 76 | Ht 62.0 in | Wt 141.0 lb

## 2022-07-02 DIAGNOSIS — R55 Syncope and collapse: Secondary | ICD-10-CM

## 2022-07-02 NOTE — Patient Instructions (Signed)
Medication Instructions:  Your physician recommends that you continue on your current medications as directed. Please refer to the Current Medication list given to you today.  *If you need a refill on your cardiac medications before your next appointment, please call your pharmacy*  Lab Work: NONE  Testing/Procedures: Your physician has requested that you wear a Zio heart monitor for 14 days. This will be mailed to your home with instructions on how to apply the monitor and how to return it when finished. Please allow 2 weeks after returning the heart monitor before our office calls you with the results.   Your physician has requested that you have an echocardiogram. Echocardiography is a painless test that uses sound waves to create images of your heart. It provides your doctor with information about the size and shape of your heart and how well your heart's chambers and valves are working. This procedure takes approximately one hour. There are no restrictions for this procedure. Please do NOT wear cologne, perfume, aftershave, or lotions (deodorant is allowed). Please arrive 15 minutes prior to your appointment time.  Follow-Up: At [facility], you and your health needs are our priority.  As part of our continuing mission to provide you with exceptional heart care, we have created designated Provider Care Teams.  These Care Teams include your primary Cardiologist (physician) and Advanced Practice Providers (APPs -  Physician Assistants and Nurse Practitioners) who all work together to provide you with the care you need, when you need it.  We recommend signing up for the patient portal called "MyChart".  Sign up information is provided on this After Visit Summary.  MyChart is used to connect with patients for Virtual Visits (Telemedicine).  Patients are able to view lab/test results, encounter notes, upcoming appointments, etc.  Non-urgent messages can be sent to your provider as well.    To learn more about what you can do with MyChart, go to [email or web address].    Your next appointment:   As needed  Provider:   Candee Furbish, MD    Other Instructions Your physician recommends increasing your water intake, along with drinks that have electrolytes. If you feel dizzy/lightheaded have a small salty snack.  ZIO XT- Long Term Monitor Instructions     Your physician has requested you wear a ZIO patch monitor for 14 days.  This is a single patch monitor. [employer] supplies one patch monitor per enrollment. Additional  stickers are not available. Please do not apply patch if you will be having a Nuclear Stress Test,  Echocardiogram, Cardiac CT, MRI, or Chest Xray during the period you would be wearing the  monitor. The patch cannot be worn during these tests. You cannot remove and re-apply the  ZIO XT patch monitor.  Your ZIO patch monitor will be mailed 3 day USPS to your address on file. It may take 3-5 days  to receive your monitor after you have been enrolled.  Once you have received your monitor, please review the enclosed instructions. Your monitor  has already been registered assigning a specific monitor serial # to you.     Billing and Patient Assistance Program Information     We have supplied [employer] with any of your insurance information on file for billing purposes.  [employer] offers a sliding scale Patient Assistance Program for patients that do not have  insurance, or whose insurance does not completely cover the cost of the ZIO monitor.  You must apply for the Patient Assistance Program to qualify  for this discounted rate.  To apply, please call [employer] at [phone], select option 4, select option 2, ask to apply for  Patient Assistance Program. Theodore Demark will ask your household income, and how many people  are in your household. They will quote your out-of-pocket cost based on that information.  [employer] will also be able to set up a 54-month, interest-free  payment plan if needed.     Applying the monitor     Shave hair from upper left chest.  Hold abrader disc by orange tab. Rub abrader in 40 strokes over the upper left chest as  indicated in your monitor instructions.  Clean area with 4 enclosed alcohol pads. Let dry.  Apply patch as indicated in monitor instructions. Patch will be placed under collarbone on left  side of chest with arrow pointing upward.  Rub patch adhesive wings for 2 minutes. Remove white label marked "1". Remove the white  label marked "2". Rub patch adhesive wings for 2 additional minutes.  While looking in a mirror, press and release button in center of patch. A small green light will  flash 3-4 times. This will be your only indicator that the monitor has been turned on.  Do not shower for the first 24 hours. You may shower after the first 24 hours.  Press the button if you feel a symptom. You will hear a small click. Record Date, Time and  Symptom in the Patient Logbook.  When you are ready to remove the patch, follow instructions on the last 2 pages of Patient  Logbook. Stick patch monitor onto the last page of Patient Logbook.  Place Patient Logbook in the blue and white box. Use locking tab on box and tape box closed  securely. The blue and white box has prepaid postage on it. Please place it in the mailbox as  soon as possible. Your physician should have your test results approximately 7 days after the  monitor has been mailed back to [facility].  Call [place] at [phone] if you have questions regarding  your ZIO XT patch monitor. Call them immediately if you see an orange light blinking on your  monitor.  If your monitor falls off in less than 4 days, contact our Monitor department at [phone].  If your monitor becomes loose or falls off after 4 days call [employer] at [phone] for  suggestions on securing your monitor.

## 2022-07-02 NOTE — Progress Notes (Unsigned)
Enrolled for [employer] to mail a ZIO XT long term holter monitor to the patients address on file.  

## 2022-07-02 NOTE — Progress Notes (Signed)
Cardiology Office Note:    Date:  07/02/2022   ID:  [name] [name], DOB April 18, 2001, MRN [id number]  PCP:  Aviva Kluver   [facility] Providers Cardiologist:  Donato Schultz, MD     Referring MD: Paris Lore, *    History of Present Illness:    [name] [name] is a 22 y.o. female here for the evaluation of syncope at the request of Haze Rushing, [place]  She had an ER visit in mid December 2023 for recurrent syncope over a few weeks.  She had had 4-5 episodes sometimes multiple's and a day.  Usually occur primarily after changing of position and felt weak lightheaded vision closing in.  Feels lightheaded, shaky then pass out. CT scan normal. ECG normal. Fainted once in the hospital. Passed out going to tairs. Wave and then sounds underwater. All started one year ago.   Has had attention deficit disorder as well as major depressive disorder.  Past Medical History:  Diagnosis Date   ADHD (attention deficit hyperactivity disorder)    Asthma    pt. reports most symptoms have resolved but has occassional SOB on exertion   Attention deficit hyperactivity disorder (ADHD) 10/10/2015   Depression     No past surgical history on file.  Current Medications: Current Meds  Medication Sig   clindamycin (CLEOCIN T) 1 % external solution daily.   ipratropium (ATROVENT) 0.06 % nasal spray Place into the nose as needed for rhinitis.   medroxyPROGESTERone (DEPO-PROVERA) 150 MG/ML injection every 3 (three) months.   minocycline (MINOCIN) 100 MG capsule once a week.   RETIN-A 0.025 % cream Apply topically daily at 6 (six) AM.   VENTOLIN HFA 108 (90 Base) MCG/ACT inhaler as needed for wheezing or shortness of breath.     Allergies:   Patient has no known allergies.   Social History   Socioeconomic History   Marital status: Single    Spouse name: Not on file   Number of children: Not on file   Years of education: Not on file   Highest education level: Not on file   Occupational History   Not on file  Tobacco Use   Smoking status: Never    Passive exposure: Yes   Smokeless tobacco: Never  Substance and Sexual Activity   Alcohol use: Never   Drug use: Never   Sexual activity: Not Currently    Comment: Pt. reports not having had sex in last 6 months, states condom was used during last encounter  Other Topics Concern   Not on file  Social History Narrative   Not on file   Social Determinants of Health   Financial Resource Strain: Not on file  Food Insecurity: Not on file  Transportation Needs: Not on file  Physical Activity: Not on file  Stress: Not on file  Social Connections: Not on file     Family History: The patient's family history is not on file. She was adopted.  ROS:   Please see the history of present illness.    No fevers chills nausea vomiting, no chest pain no shortness of breath all other systems reviewed and are negative.  EKGs/Labs/Other Studies Reviewed:    The following studies were reviewed today: ER notes reviewed lab work reviewed.  Hemoglobin 12.6.  She does state that she has iron deficiency anemia and had an iron transfusion at [facility].  EKG:   short PR interval 68 bpm no delta wave.  Recent Labs: 08/03/2021: ALT 10 05/25/2022: BUN 14;  Creatinine, Ser 0.85; Hemoglobin 12.6; Platelets 286; Potassium 3.6; Sodium 140  Recent Lipid Panel    Component Value Date/Time   CHOL 146 12/18/2015 0629   TRIG 64 12/18/2015 0629   HDL 42 12/18/2015 0629   CHOLHDL 3.5 12/18/2015 0629   VLDL 13 12/18/2015 0629   LDLCALC 91 12/18/2015 0629     Risk Assessment/Calculations:               Physical Exam:    VS:  BP 100/60   Pulse 76   Ht 5\' 2"  (1.575 m)   Wt 141 lb (64 kg)   SpO2 99%   BMI 25.79 kg/m     Wt Readings from Last 3 Encounters:  07/02/22 141 lb (64 kg)  05/24/22 129 lb (58.5 kg)  05/14/22 130 lb (59 kg)     GEN:  Well nourished, well developed in no acute distress HEENT: Normal NECK: No  JVD; No carotid bruits LYMPHATICS: No lymphadenopathy CARDIAC: RRR, no murmurs, rubs, gallops RESPIRATORY:  Clear to auscultation without rales, wheezing or rhonchi  ABDOMEN: Soft, non-tender, non-distended MUSCULOSKELETAL:  No edema; No deformity  SKIN: Warm and dry NEUROLOGIC:  Alert and oriented x 3 PSYCHIATRIC:  Normal affect   ASSESSMENT:    1. Vasovagal syncope    PLAN:    In order of problems listed above:  -Check echocardiogram - Check ZIO monitor - Liberalize salt and fluid intake - Lay down when necessary.  She says that this terminates episodes. - Compression socks can help.          Medication Adjustments/Labs and Tests Ordered: Current medicines are reviewed at length with the patient today.  Concerns regarding medicines are outlined above.  Orders Placed This Encounter  Procedures   LONG TERM MONITOR (3-14 DAYS)   ECHOCARDIOGRAM COMPLETE   No orders of the defined types were placed in this encounter.   Patient Instructions  Medication Instructions:  Your physician recommends that you continue on your current medications as directed. Please refer to the Current Medication list given to you today.  *If you need a refill on your cardiac medications before your next appointment, please call your pharmacy*  Lab Work: NONE  Testing/Procedures: Your physician has requested that you wear a Zio heart monitor for 14 days. This will be mailed to your home with instructions on how to apply the monitor and how to return it when finished. Please allow 2 weeks after returning the heart monitor before our office calls you with the results.   Your physician has requested that you have an echocardiogram. Echocardiography is a painless test that uses sound waves to create images of your heart. It provides your doctor with information about the size and shape of your heart and how well your heart's chambers and valves are working. This procedure takes approximately one  hour. There are no restrictions for this procedure. Please do NOT wear cologne, perfume, aftershave, or lotions (deodorant is allowed). Please arrive 15 minutes prior to your appointment time.  Follow-Up: At [facility], you and your health needs are our priority.  As part of our continuing mission to provide you with exceptional heart care, we have created designated Provider Care Teams.  These Care Teams include your primary Cardiologist (physician) and Advanced Practice Providers (APPs -  Physician Assistants and Nurse Practitioners) who all work together to provide you with the care you need, when you need it.  We recommend signing up for the patient portal called "MyChart".  Sign up information is provided on this After Visit Summary.  MyChart is used to connect with patients for Virtual Visits (Telemedicine).  Patients are able to view lab/test results, encounter notes, upcoming appointments, etc.  Non-urgent messages can be sent to your provider as well.   To learn more about what you can do with MyChart, go to [email or web address].    Your next appointment:   As needed  Provider:   Candee Furbish, MD    Other Instructions Your physician recommends increasing your water intake, along with drinks that have electrolytes. If you feel dizzy/lightheaded have a small salty snack.  ZIO XT- Long Term Monitor Instructions     Your physician has requested you wear a ZIO patch monitor for 14 days.  This is a single patch monitor. [employer] supplies one patch monitor per enrollment. Additional  stickers are not available. Please do not apply patch if you will be having a Nuclear Stress Test,  Echocardiogram, Cardiac CT, MRI, or Chest Xray during the period you would be wearing the  monitor. The patch cannot be worn during these tests. You cannot remove and re-apply the  ZIO XT patch monitor.  Your ZIO patch monitor will be mailed 3 day USPS to your address on file. It may take 3-5 days   to receive your monitor after you have been enrolled.  Once you have received your monitor, please review the enclosed instructions. Your monitor  has already been registered assigning a specific monitor serial # to you.     Billing and Patient Assistance Program Information     We have supplied [employer] with any of your insurance information on file for billing purposes.  [employer] offers a sliding scale Patient Assistance Program for patients that do not have  insurance, or whose insurance does not completely cover the cost of the ZIO monitor.  You must apply for the Patient Assistance Program to qualify for this discounted rate.  To apply, please call [employer] at [phone], select option 4, select option 2, ask to apply for  Patient Assistance Program. Theodore Demark will ask your household income, and how many people  are in your household. They will quote your out-of-pocket cost based on that information.  [employer] will also be able to set up a 74-month, interest-free payment plan if needed.     Applying the monitor     Shave hair from upper left chest.  Hold abrader disc by orange tab. Rub abrader in 40 strokes over the upper left chest as  indicated in your monitor instructions.  Clean area with 4 enclosed alcohol pads. Let dry.  Apply patch as indicated in monitor instructions. Patch will be placed under collarbone on left  side of chest with arrow pointing upward.  Rub patch adhesive wings for 2 minutes. Remove white label marked "1". Remove the white  label marked "2". Rub patch adhesive wings for 2 additional minutes.  While looking in a mirror, press and release button in center of patch. A small green light will  flash 3-4 times. This will be your only indicator that the monitor has been turned on.  Do not shower for the first 24 hours. You may shower after the first 24 hours.  Press the button if you feel a symptom. You will hear a small click. Record Date, Time and  Symptom in  the Patient Logbook.  When you are ready to remove the patch, follow instructions on the last 2 pages of Patient  Logbook. Stick patch  monitor onto the last page of Patient Logbook.  Place Patient Logbook in the blue and white box. Use locking tab on box and tape box closed  securely. The blue and white box has prepaid postage on it. Please place it in the mailbox as  soon as possible. Your physician should have your test results approximately 7 days after the  monitor has been mailed back to [facility].  Call [place] at [phone] if you have questions regarding  your ZIO XT patch monitor. Call them immediately if you see an orange light blinking on your  monitor.  If your monitor falls off in less than 4 days, contact our Monitor department at [phone].  If your monitor becomes loose or falls off after 4 days call [employer] at [phone] for  suggestions on securing your monitor.     Signed, Candee Furbish, MD  07/02/2022 3:26 PM    [place]

## 2022-07-23 ENCOUNTER — Other Ambulatory Visit (HOSPITAL_COMMUNITY): Payer: Medicaid Other

## 2022-07-23 ENCOUNTER — Encounter (HOSPITAL_COMMUNITY): Payer: Self-pay | Admitting: Cardiology

## 2022-09-03 ENCOUNTER — Encounter: Payer: Medicaid Other | Admitting: Obstetrics and Gynecology

## 2022-09-29 ENCOUNTER — Encounter: Payer: Medicaid Other | Admitting: Family Medicine
